# Patient Record
Sex: Female | Born: 1975 | Race: White | Hispanic: No | Marital: Single | Smoking: Never smoker
Health system: Southern US, Community
[De-identification: ages and names within clinical notes are randomized; demographics above are authoritative.]

## PROBLEM LIST (undated history)

## (undated) DIAGNOSIS — K5090 Crohn's disease, unspecified, without complications: Secondary | ICD-10-CM

## (undated) HISTORY — PX: APPENDECTOMY: SHX54

---

## 1995-05-28 HISTORY — PX: BREAST LUMPECTOMY: SHX2

## 2008-03-07 DIAGNOSIS — K5080 Crohn's disease of both small and large intestine without complications: Secondary | ICD-10-CM | POA: Insufficient documentation

## 2010-03-28 ENCOUNTER — Emergency Department: Payer: Self-pay | Admitting: Emergency Medicine

## 2010-03-29 ENCOUNTER — Emergency Department: Payer: Self-pay | Admitting: Emergency Medicine

## 2010-04-01 ENCOUNTER — Emergency Department: Payer: Self-pay | Admitting: Emergency Medicine

## 2010-04-05 ENCOUNTER — Emergency Department: Payer: Self-pay | Admitting: Emergency Medicine

## 2010-04-12 ENCOUNTER — Emergency Department: Payer: Self-pay | Admitting: Emergency Medicine

## 2014-04-09 ENCOUNTER — Emergency Department: Payer: Self-pay | Admitting: Internal Medicine

## 2016-10-08 ENCOUNTER — Emergency Department
Admission: EM | Admit: 2016-10-08 | Discharge: 2016-10-08 | Disposition: A | Discharge status: Home / Self Care | Payer: BLUE CROSS/BLUE SHIELD | Attending: Emergency Medicine | Admitting: Emergency Medicine

## 2016-10-08 ENCOUNTER — Emergency Department: Payer: BLUE CROSS/BLUE SHIELD

## 2016-10-08 ENCOUNTER — Encounter: Payer: Self-pay | Admitting: Emergency Medicine

## 2016-10-08 DIAGNOSIS — K5080 Crohn's disease of both small and large intestine without complications: Secondary | ICD-10-CM

## 2016-10-08 DIAGNOSIS — R1031 Right lower quadrant pain: Secondary | ICD-10-CM | POA: Diagnosis present

## 2016-10-08 HISTORY — DX: Crohn's disease, unspecified, without complications: K50.90

## 2016-10-08 LAB — COMPREHENSIVE METABOLIC PANEL
ALK PHOS: 56 U/L (ref 38–126)
ALT: 11 U/L — ABNORMAL LOW (ref 14–54)
AST: 12 U/L — ABNORMAL LOW (ref 15–41)
Albumin: 2.7 g/dL — ABNORMAL LOW (ref 3.5–5.0)
Anion gap: 6 (ref 5–15)
BILIRUBIN TOTAL: 0.7 mg/dL (ref 0.3–1.2)
BUN: 6 mg/dL (ref 6–20)
CO2: 26 mmol/L (ref 22–32)
CREATININE: 0.67 mg/dL (ref 0.44–1.00)
Calcium: 8.1 mg/dL — ABNORMAL LOW (ref 8.9–10.3)
Chloride: 106 mmol/L (ref 101–111)
GFR calc non Af Amer: >60 mL/min (ref >60)
Glucose, Bld: 164 mg/dL — ABNORMAL HIGH (ref 65–99)
POTASSIUM: 4.8 mmol/L (ref 3.5–5.1)
Sodium: 138 mmol/L (ref 135–145)
Total Protein: 6.2 g/dL — ABNORMAL LOW (ref 6.5–8.1)

## 2016-10-08 LAB — URINALYSIS, COMPLETE (UACMP) WITH MICROSCOPIC
BACTERIA UA: NONE SEEN
Bilirubin Urine: NEGATIVE
Glucose, UA: NEGATIVE mg/dL
Hgb urine dipstick: NEGATIVE
KETONES UR: 20 mg/dL — AB
Leukocytes, UA: NEGATIVE
Nitrite: NEGATIVE
PROTEIN: NEGATIVE mg/dL
Specific Gravity, Urine: 1.02 (ref 1.005–1.030)
pH: 5 (ref 5.0–8.0)

## 2016-10-08 LAB — POCT PREGNANCY, URINE: PREG TEST UR: NEGATIVE

## 2016-10-08 LAB — CBC
HEMATOCRIT: 35.5 % (ref 35.0–47.0)
HEMOGLOBIN: 11.3 g/dL — AB (ref 12.0–16.0)
MCH: 27.7 pg (ref 26.0–34.0)
MCHC: 31.8 g/dL — AB (ref 32.0–36.0)
MCV: 87.2 fL (ref 80.0–100.0)
Platelets: 455 10*3/uL — ABNORMAL HIGH (ref 150–440)
RBC: 4.08 MIL/uL (ref 3.80–5.20)
RDW: 18.5 % — ABNORMAL HIGH (ref 11.5–14.5)
WBC: 6.4 10*3/uL (ref 3.6–11.0)

## 2016-10-08 LAB — LIPASE, BLOOD: LIPASE: 14 U/L (ref 11–51)

## 2016-10-08 MED ORDER — IOPAMIDOL (ISOVUE-300) INJECTION 61%
15.0000 mL | INTRAVENOUS | Status: AC
  Administered 2016-10-08 (×2): 15 mL via ORAL

## 2016-10-08 MED ORDER — MORPHINE SULFATE (PF) 4 MG/ML IV SOLN
4.0000 mg | Freq: Once | INTRAVENOUS | Status: AC
Start: 2016-10-08 — End: 2016-10-08
  Administered 2016-10-08: 4 mg via INTRAVENOUS
  Filled 2016-10-08: qty 1

## 2016-10-08 MED ORDER — OXYCODONE-ACETAMINOPHEN 5-325 MG PO TABS
1.0000 | ORAL_TABLET | Freq: Once | ORAL | Status: AC
  Administered 2016-10-08: 1 via ORAL
  Filled 2016-10-08: qty 1

## 2016-10-08 MED ORDER — ONDANSETRON 4 MG PO TBDP: 4.0000 mg | ORAL_TABLET | Freq: Three times a day (TID) | ORAL | 0 refills | Status: DC | PRN

## 2016-10-08 MED ORDER — OXYCODONE-ACETAMINOPHEN 5-325 MG PO TABS: 1.0000 | ORAL_TABLET | ORAL | 0 refills | Status: DC | PRN

## 2016-10-08 MED ORDER — PREDNISONE 20 MG PO TABS: 60.0000 mg | ORAL_TABLET | Freq: Every day | ORAL | 0 refills | Status: AC

## 2016-10-08 MED ORDER — METHYLPREDNISOLONE SODIUM SUCC 125 MG IJ SOLR
125.0000 mg | Freq: Once | INTRAMUSCULAR | Status: AC
  Administered 2016-10-08: 125 mg via INTRAVENOUS
  Filled 2016-10-08: qty 2

## 2016-10-08 MED ORDER — IOPAMIDOL (ISOVUE-300) INJECTION 61%
100.0000 mL | Freq: Once | INTRAVENOUS | Status: AC | PRN
  Administered 2016-10-08: 100 mL via INTRAVENOUS

## 2016-10-08 MED ORDER — ONDANSETRON HCL 4 MG/2ML IJ SOLN
4.0000 mg | Freq: Once | INTRAMUSCULAR | Status: AC
  Administered 2016-10-08: 4 mg via INTRAVENOUS
  Filled 2016-10-08: qty 2

## 2016-10-08 NOTE — ED Triage Notes (Signed)
Pt c/o mid lower abdominal/suprapubic pain with nausea since last night. No vomiting.  Has crohns so always has diarrhea. PT reports normal BP in 90s.  Appears pale but boyfriend reports this is her normal color she just looks tired.

## 2016-10-08 NOTE — ED Provider Notes (Signed)
[facility] Emergency Department Provider Note    First MD Initiated Contact with Patient 10/08/16 1112     (approximate)  I have reviewed the triage vital signs and the nursing notes.   HISTORY  Chief Complaint Abdominal Pain   HPI Joann Haley is a 41 y.o. female with history of Crohn's disease presents with right lower quadrant abdominal pain that is described as sharp current pain score is 8 out of 10. Patient states pain started yesterday evening at 5 PM has progressively worsened since that time. She denies any nausea vomiting or diarrhea. Patient denies any rectal bleeding. Patient denies any fever. Patient states her last Crohn's flare was approximately 2-3 years ago. Patient does state that she is not on any current biologic treatment for Crohn's.   Past Medical History:  Diagnosis Date  . Crohn's disease (HCC)     There are no active problems to display for this patient.   Past Surgical History:  Procedure Laterality Date  . APPENDECTOMY      Prior to Admission medications   Medication Sig Start Date End Date Taking? Authorizing Provider  UNABLE TO FIND Take 1 tablet by mouth daily. Med Name: Low dose naltraxone 2.5 mg once daily   Yes [provider]  ondansetron (ZOFRAN ODT) 4 MG disintegrating tablet Take 1 tablet (4 mg total) by mouth every 8 (eight) hours as needed for nausea or vomiting. 10/08/16   Darci Current, MD  oxyCODONE-acetaminophen (ROXICET) 5-325 MG tablet Take 1 tablet by mouth every 4 (four) hours as needed for severe pain. 10/08/16   Darci Current, MD  predniSONE (DELTASONE) 20 MG tablet Take 3 tablets (60 mg total) by mouth daily. 10/08/16 10/13/16  Darci Current, MD    Allergies Peppermint oil  History reviewed. No pertinent family history.  Social History Social History  Substance Use Topics  . Smoking status: Never Smoker  . Smokeless tobacco: Never Used  . Alcohol use No    Review of  Systems Constitutional: No fever/chills Eyes: No visual changes. ENT: No sore throat. Cardiovascular: Denies chest pain. Respiratory: Denies shortness of breath. Gastrointestinal: Positive right lower quadrant abdominal pain.  No nausea, no vomiting.  No diarrhea.  No constipation. Genitourinary: Negative for dysuria. Musculoskeletal: Negative for back pain. Integumentary: Negative for rash. Neurological: Negative for headaches, focal weakness or numbness.  ____________________________________________   PHYSICAL EXAM:  VITAL SIGNS: ED Triage Vitals  Enc Vitals Group     BP 10/08/16 0943 (!) 98/55     Pulse Rate 10/08/16 0943 (!) 52     Resp 10/08/16 0943 16     Temp 10/08/16 0943 97.5 F (36.4 C)     Temp Source 10/08/16 0943 Oral     SpO2 10/08/16 0943 100 %     Weight 10/08/16 0945 135 lb (61.2 kg)     Height 10/08/16 0945 5\' 7"  (1.702 m)     Head Circumference --      Peak Flow --      Pain Score 10/08/16 0943 10     Pain Loc --      Pain Edu? --      Excl. in GC? --     Constitutional: Alert and oriented. Well appearing and in no acute distress. Eyes: Conjunctivae are normal. PERRL. EOMI. Head: Atraumatic.Marland Kitchen Mouth/Throat: Mucous membranes are moist.  Oropharynx non-erythematous. Neck: No stridor.  Cardiovascular: Normal rate, regular rhythm. Good peripheral circulation. Grossly normal heart sounds. Respiratory: Normal respiratory  effort.  No retractions. Lungs CTAB. Gastrointestinal: Right lower quadrant tenderness to palpation. No distention.   Musculoskeletal: No lower extremity tenderness nor edema. No gross deformities of extremities. Neurologic:  Normal speech and language. No gross focal neurologic deficits are appreciated.  Skin:  Skin is warm, dry and intact. No rash noted. Psychiatric: Mood and affect are normal. Speech and behavior are normal.  ____________________________________________   LABS (all labs ordered are listed, but only abnormal results  are displayed)  Labs Reviewed  COMPREHENSIVE METABOLIC PANEL - Abnormal; Notable for the following:       Result Value   Glucose, Bld 164 (*)    Calcium 8.1 (*)    Total Protein 6.2 (*)    Albumin 2.7 (*)    AST 12 (*)    ALT 11 (*)    All other components within normal limits  CBC - Abnormal; Notable for the following:    Hemoglobin 11.3 (*)    MCHC 31.8 (*)    RDW 18.5 (*)    Platelets 455 (*)    All other components within normal limits  URINALYSIS, COMPLETE (UACMP) WITH MICROSCOPIC - Abnormal; Notable for the following:    Color, Urine YELLOW (*)    APPearance HAZY (*)    Ketones, ur 20 (*)    Squamous Epithelial / LPF 0-5 (*)    All other components within normal limits  LIPASE, BLOOD  POC URINE PREG, ED  POCT PREGNANCY, URINE     RADIOLOGY I, Easton N Joann Haley, personally viewed and evaluated these images (plain radiographs) as part of my medical decision making, as well as reviewing the written report by the radiologist.  Ct Abdomen Pelvis W Contrast  Result Date: 10/08/2016 CLINICAL DATA:  Pt c/o mid lower abdominal/suprapubic pain with nausea since last night. No vomiting. Has crohns so always has diarrhea. PT reports normal BP in 90s. Appears pale but boyfriend reports this is her normal color she just looks ti.*comment was truncated*^11900mL ISOVUE-300 IOPAMIDOL (ISOVUE-300) INJECTION 61% EXAM: CT ABDOMEN AND PELVIS WITH CONTRAST TECHNIQUE: Multidetector CT imaging of the abdomen and pelvis was performed using the standard protocol following bolus administration of intravenous contrast. CONTRAST:  100mL ISOVUE-300 IOPAMIDOL (ISOVUE-300) INJECTION 61% COMPARISON:  None. FINDINGS: Lower chest: No acute abnormality. Hepatobiliary: Gallbladder is distended but otherwise unremarkable. No focal liver abnormality is seen. No biliary dilatation. Pancreas: Unremarkable. No pancreatic ductal dilatation or surrounding inflammatory changes. Spleen: Normal in size without focal  abnormality. Adrenals/Urinary Tract: Adrenal glands are unremarkable. Kidneys are normal, without renal calculi, focal lesion, or hydronephrosis. Bladder is unremarkable. Stomach/Bowel: Edematous thickening of the walls of the entire colon, moderate amount of stool in the right colon. Fairly severe thickening of the walls of the terminal ileum. Milder thickening of the walls of the more proximal ileum which has a patulous appearance. The more proximal small bowel (duodenum and jejunum) appear normal. Vascular/Lymphatic: No significant vascular findings are present. No enlarged abdominal or pelvic lymph nodes. Reproductive: Cystic-appearing mass in the right adnexa, measuring 3.1 cm, difficult to characterize due to surrounding free fluid, most likely a small ovarian cyst. No left adnexal mass. Other: Moderate free fluid in the pelvis. No circumscribed abscess collection. No free intraperitoneal air seen. No pneumatosis intestinalis seen. No bowel fistula identified. Musculoskeletal: Mild levoscoliosis of the thoracolumbar spine. Mild disc desiccations in the lower lumbar spine. No acute or suspicious osseous finding. Superficial soft tissues are unremarkable. IMPRESSION: 1. Fairly marked thickening of the walls of the distal small  bowel, involving the terminal ileum and distal ileum, compatible with active phase of patient's known Crohn's disease. Milder thickening of the walls of the more proximal ileum with an associated patulous configuration. Duodenum and jejunum appear normal. No associated bowel obstruction. 2. Additional thickening of the walls of the entire colon, moderate in degree, with associated pericolonic inflammation, also likely related to patient's known inflammatory bowel disease. Moderate amount of stool within the right colon, possibly associated focal colonic ileus? Again, no evidence of associated bowel obstruction. 3. Moderate amount of free fluid in the lower pelvis, likely related to the  active inflammatory bowel disease. No abscess collection seen. No free intraperitoneal air. No bowel fistula seen. 4. Additional chronic/incidental findings detailed above. Electronically Signed   By: Bary Richard M.D.   On: 10/08/2016 13:25      Procedures   ____________________________________________   INITIAL IMPRESSION / ASSESSMENT AND PLAN / ED COURSE  Pertinent labs & imaging results that were available during my care of the patient were reviewed by me and considered in my medical decision making (see chart for details).  41 year old female with history of Crohn's disease presents with right lower quadrant abdominal pain. Suspect Crohn's exacerbation as etiology for the patient's pain CT scan consistent with beforementioned. As such patient received IV morphine and Zofran emergency department improvement of pain from 8 out of 10-5 out of 10. Patient subsequently given IV Solu-Medrol and 25 mg, Percocet 09/27/2023 one tablet. Patient is advised to follow-up with gastroenterologist Dr. Tobi Bastos for further outpatient evaluation and management. Patient prescribed prednisone Percocet and Zofran for home      ____________________________________________  FINAL CLINICAL IMPRESSION(S) / ED DIAGNOSES  Final diagnoses:  Crohn's disease of both small and large intestine without complication (HCC)     MEDICATIONS GIVEN DURING THIS VISIT:  Medications  morphine 4 MG/ML injection 4 mg (4 mg Intravenous Given 10/08/16 1148)  ondansetron (ZOFRAN) injection 4 mg (4 mg Intravenous Given 10/08/16 1148)  iopamidol (ISOVUE-300) 61 % injection 15 mL (15 mLs Oral Contrast Given 10/08/16 1238)  iopamidol (ISOVUE-300) 61 % injection 100 mL (100 mLs Intravenous Contrast Given 10/08/16 1303)  methylPREDNISolone sodium succinate (SOLU-MEDROL) 125 mg/2 mL injection 125 mg (125 mg Intravenous Given 10/08/16 1356)  oxyCODONE-acetaminophen (PERCOCET/ROXICET) 5-325 MG per tablet 1 tablet (1 tablet Oral Given  10/08/16 1428)     NEW OUTPATIENT MEDICATIONS STARTED DURING THIS VISIT:  Discharge Medication List as of 10/08/2016  3:16 PM    START taking these medications   Details  ondansetron (ZOFRAN ODT) 4 MG disintegrating tablet Take 1 tablet (4 mg total) by mouth every 8 (eight) hours as needed for nausea or vomiting., Starting Tue 10/08/2016, Print    oxyCODONE-acetaminophen (ROXICET) 5-325 MG tablet Take 1 tablet by mouth every 4 (four) hours as needed for severe pain., Starting Tue 10/08/2016, Print    predniSONE (DELTASONE) 20 MG tablet Take 3 tablets (60 mg total) by mouth daily., Starting Tue 10/08/2016, Until Sun 10/13/2016, Print        Discharge Medication List as of 10/08/2016  3:16 PM      Discharge Medication List as of 10/08/2016  3:16 PM       Note:  This document was prepared using Dragon voice recognition software and may include unintentional dictation errors.    Darci Current, MD 10/08/16 2213

## 2016-10-08 NOTE — ED Notes (Signed)
Iv dc'ed  D/c inst to pt and family.   

## 2017-04-21 ENCOUNTER — Other Ambulatory Visit: Payer: Self-pay

## 2017-04-22 ENCOUNTER — Encounter: Payer: Self-pay | Admitting: Gastroenterology

## 2017-04-22 ENCOUNTER — Other Ambulatory Visit: Payer: Self-pay

## 2017-04-22 ENCOUNTER — Ambulatory Visit (INDEPENDENT_AMBULATORY_CARE_PROVIDER_SITE_OTHER): Payer: BLUE CROSS/BLUE SHIELD | Admitting: Gastroenterology

## 2017-04-22 VITALS — BP 105/69 | HR 76 | Temp 98.2°F | Ht 67.0 in | Wt 139.8 lb

## 2017-04-22 DIAGNOSIS — K50813 Crohn's disease of both small and large intestine with fistula: Secondary | ICD-10-CM | POA: Diagnosis not present

## 2017-04-22 MED ORDER — RIFAXIMIN 550 MG PO TABS: 550.0000 mg | ORAL_TABLET | Freq: Three times a day (TID) | ORAL | 0 refills | Status: DC

## 2017-04-22 NOTE — Progress Notes (Signed)
Cephas Darby, MD [address]  [place]  [place], [place] [address]  Main: [phone]  Fax: [phone]    Gastroenterology Consultation  Referring Provider:     No ref. provider found Primary Care Physician:  Patient, No Pcp Per Primary Gastroenterologist:  Dr. Darrel Hoover Reason for Consultation:  Establish care for Crohn's disease        HPI:   Niko Jakel is a 41 y.o. female referred by Dr. Patient, No Pcp Per  for consultation & management of Crohn's disease.  She has history of ileocolonic, inflammatory Crohn's disease diagnosed in 2014 by Dr. Darrel Hoover at [facility] has not been on any immunosuppressive therapy since the diagnosis is made.  Apparently, she was recommended to be started on anti-TNF therapy but patient refused to take as she is worried about side effects and she generally does not tolerate most of the medications.  Currently, she reports having an average 4 BMs daily, soft, brown, nonbloody and not a/w abd pain, urgency, n/v/weight loss, nocturnal diarrhea, fecal incontinence.  She denies any upper GI symptoms.  She is trying modified carbohydrate diet that she thinks might be effective in controlling inflammation as she read a lot about the role of diet and Crohn's disease.  She is also taking curcumin daily.  Her most recent flareup was in 09/2016 when she presented to [facility] ER and she was discharged on prednisone with plan to follow-up with Dr. Vicente Males.  She did not tolerate prednisone well because it resulted in irritability.  She had mild anemia with thrombocytosis and hypoalbuminemia at that time. 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  She currently has a seton in place, denies pain, drainage  Crohn's disease classification:  Age: 27 to 66  Location: ileocolonic  Behavior: non stricturing, non penetrating   Perianal: Yes    IBD diagnosis: At age 31  Disease course: Started having GI  problems in elementary school, anorexia, cachexia, and sometime in high school she was originally diagnosed with UC.  Started on Azulfidine and prednisone.  She did not tolerate prednisone well.  She later saw Dr. Barbaraann Barthel at [facility] in early 2014, underwent colonoscopy was found to have ileocolonic disease and diagnosis of Crohn's disease was made.  Celiac serologies negative  Extra intestinal manifestations: None  IBD surgical history: Seton placement for perianal fistula, which was performed about 1 year ago.  Seton in place  She denies family history of IBD She does not smoke or drink etoh She is single, currently in a relationship, working  Imaging:  MRE not done CTE 09/16/2012 IMPRESSION: - Long segment wall thickening, mucosal enhancement and prominent adjacent vasculature identified throughout distal small bowel and colon. The colon is lacking in haustral markings. 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   SBFT none  Procedures:  Colonoscopy 09/16/2012 at [facility] - Hemorrhagic, inflamed and ulcerated mucosa in skip lesions in the entire examined colon, right > left.  Areas of scarring from prior inflammation. Biopsied from the right and left colon. Congested, erythematous and ulcerated mucosa in the terminal ileum. Biopsied. The distal rectum and anal verge are normal on retroflexion view. Diagnosis: A: Terminal ileum, biopsy  - Moderately active chronic enteritis with detached fragments of ulcer debris and inflamed granulation tissue  - No viral cytopathic effect, granuloma, or dysplasia identified - See comment   B: Colon, right, biopsy  -  Moderately active chronic colitis with detached fragments of inflamed granulation tissue and ulcer debris - No viral cytopathic effect, granuloma, or dysplasia identified  - See comment   C: Colon, left, biopsy  - Moderately active chronic colitis with detached fragments  of inflamed granulation tissue and ulcer debris - No viral cytopathic effect, granuloma, or dysplasia identified  Upper Endoscopy none  VCE none  IBD medications:  Steroids: Prednisone did not tolerate due to side effects, budesonide did not work 5-ASA: Azulfidine and Lialda, not effective Immunomodulators: AZA, methotrexate did not try TPMT status unknown Biologics: Nave Anti TNFs: Anti Integrins: Ustekinumab: Tofactinib: Clinical trial:  NSAIDs: None  Antiplts/Anticoagulants/Anti thrombotics: None  Outside labs from 03/21/2017 BMP normal CBC hemoglobin 11.5, MCV 90, platelets 419 Lipid panel normal Hepatic function panel normal LFTs except albumin 3.0 Vitamin B12 568 folate 18.9 TSH 1.6 Vitamin D 23.5 ESR 28 CRP 23.3 Ferritin 12  Past Medical History:  Diagnosis Date  . Crohn's disease [facility])     Past Surgical History:  Procedure Laterality Date  . APPENDECTOMY    . BREAST LUMPECTOMY Right 1997    Prior to Admission medications   Medication Sig Start Date End Date Taking? Authorizing Provider  ferrous sulfate 325 (65 FE) MG EC tablet Take 325 mg by mouth.    [provider]  folic acid (FOLVITE) 1 MG tablet Take 1 mg by mouth. 04/23/11   [provider]  Multiple Vitamin (MULTI-VITAMINS) TABS Take by mouth.    [provider]    Family History  Problem Relation Age of Onset  . Diabetes Mother   . Multiple myeloma Father      Social History   Tobacco Use  . Smoking status: Never Smoker  . Smokeless tobacco: Never Used  Substance Use Topics  . Alcohol use: No  . Drug use: No    Allergies as of 04/22/2017 - Review Complete 10/08/2016  Allergen Reaction Noted  . Peppermint oil  09/16/2012  . Latex Rash and Itching 09/16/2012    Review of Systems:    All systems reviewed and negative except where noted in HPI.   Physical Exam:  BP 105/69   Pulse 76   Temp 98.2 F (36.8 C) (Oral)   Ht '5\' 7"'  (1.702 m)   Wt 139  lb 12.8 oz (63.4 kg)   BMI 21.90 kg/m  No LMP recorded.  General:   Alert, thin built, pleasant and cooperative in NAD Head:  Normocephalic and atraumatic. Eyes:  Sclera clear, no icterus.   Conjunctiva pink. Ears:  Normal auditory acuity. Nose:  No deformity, discharge, or lesions. Mouth:  No deformity or lesions,oropharynx pink & moist. Neck:  Supple; no masses or thyromegaly. Lungs:  Respirations even and unlabored.  Clear throughout to auscultation.   No wheezes, crackles, or rhonchi. No acute distress. Heart:  Regular rate and rhythm; no murmurs, clicks, rubs, or gallops. Abdomen:  Normal bowel sounds. Soft, non-tender and non-distended without masses, hepatosplenomegaly or hernias noted.  No guarding or rebound tenderness.   Rectal: Nor performed Msk:  Symmetrical without gross deformities. Good, equal movement & strength bilaterally. Pulses:  Normal pulses noted. Extremities:  No clubbing or edema.  No cyanosis. Neurologic:  Alert and oriented x3;  grossly normal neurologically. Skin:  Intact without significant lesions or rashes. No jaundice. Lymph Nodes:  No significant cervical adenopathy. Psych:  Alert and cooperative. Normal mood and affect.  Imaging Studies: Reviewed  Assessment and Plan:   Tarika Mckethan is a  41 y.o. white female with ileocolonic Crohn's disease diagnosed in 08/2012 previously on mesalamine, prednisone and budesonide, currently not on any maintenance therapy has been doing fairly well clinically.  She does have iron deficiency anemia and hypoalbuminemia secondary to untreated Crohn's disease.  Based on her colonoscopy results I strongly recommended her to be started on immunosuppressive therapy but patient is persistent in not pursuing this route as she strongly believes that making dietary adjustments may get her disease under control.  I discussed with her about long-term complications associated with Crohn's disease including malignancy, infections,  malnutrition, surgery.  She inquired about fecal transplantation but we do not have any evidence to support that in IBD population at present. I suggested her trial of rifaximin for 4 weeks based on recent small study and she is agreeable to that She will continue curcumin extract.  She did not seem to be interested in trying other mesalamine formulation I counseled her about immunizations against hepatitis a and B, pneumonia and influenza.  She is immune to hepatitis B.  She declined influenza and pneumonia vaccines. She will continue taking oral iron for iron deficiency anemia Recommend that she will need surveillance colonoscopies for dysplasia surveillance in 8 years from disease onset every 1-2 years  Follow up in 43month   RCephas Darby MD

## 2017-05-16 ENCOUNTER — Other Ambulatory Visit: Payer: Self-pay | Admitting: Gastroenterology

## 2017-05-28 ENCOUNTER — Telehealth: Payer: Self-pay | Admitting: Gastroenterology

## 2017-05-28 NOTE — Telephone Encounter (Signed)
Pt stated that she did not need rx for xifaxan because it made her symptoms worse.  She stated that she is watching what she eats and taking a probiotic called "Probium".  Thanks [employer]le

## 2017-05-28 NOTE — Telephone Encounter (Signed)
Patient left a voice message that she would like for you to call her. She doesn't need the Xifaxan called in. It increased her symtoms and didn't do well with her. Please call her.

## 2017-09-19 ENCOUNTER — Telehealth: Payer: Self-pay | Admitting: Gastroenterology

## 2017-09-19 NOTE — Telephone Encounter (Signed)
Called patient regarding recall appt. Victorino DikeJennifer stated that she is seeing a HydrologistGastorenterologist in [place].

## 2017-11-01 ENCOUNTER — Emergency Department: Payer: BLUE CROSS/BLUE SHIELD

## 2017-11-01 ENCOUNTER — Other Ambulatory Visit: Payer: Self-pay

## 2017-11-01 ENCOUNTER — Emergency Department
Admission: EM | Admit: 2017-11-01 | Discharge: 2017-11-01 | Disposition: A | Discharge status: Home / Self Care | Payer: BLUE CROSS/BLUE SHIELD | Attending: Emergency Medicine | Admitting: Emergency Medicine

## 2017-11-01 DIAGNOSIS — Z79899 Other long term (current) drug therapy: Secondary | ICD-10-CM | POA: Diagnosis not present

## 2017-11-01 DIAGNOSIS — R110 Nausea: Secondary | ICD-10-CM | POA: Insufficient documentation

## 2017-11-01 DIAGNOSIS — N200 Calculus of kidney: Secondary | ICD-10-CM

## 2017-11-01 DIAGNOSIS — R109 Unspecified abdominal pain: Secondary | ICD-10-CM | POA: Diagnosis present

## 2017-11-01 DIAGNOSIS — N23 Unspecified renal colic: Secondary | ICD-10-CM | POA: Diagnosis not present

## 2017-11-01 LAB — URINALYSIS, COMPLETE (UACMP) WITH MICROSCOPIC
BACTERIA UA: NONE SEEN
BILIRUBIN URINE: NEGATIVE
Glucose, UA: NEGATIVE mg/dL
KETONES UR: NEGATIVE mg/dL
Leukocytes, UA: NEGATIVE
Nitrite: NEGATIVE
PH: 5 (ref 5.0–8.0)
PROTEIN: NEGATIVE mg/dL
Specific Gravity, Urine: 1.02 (ref 1.005–1.030)

## 2017-11-01 LAB — CBC
HCT: 35.3 % (ref 35.0–47.0)
HEMOGLOBIN: 12.4 g/dL (ref 12.0–16.0)
MCH: 31.5 pg (ref 26.0–34.0)
MCHC: 35.1 g/dL (ref 32.0–36.0)
MCV: 89.8 fL (ref 80.0–100.0)
PLATELETS: 385 10*3/uL (ref 150–440)
RBC: 3.93 MIL/uL (ref 3.80–5.20)
RDW: 16.1 % — ABNORMAL HIGH (ref 11.5–14.5)
WBC: 9.1 10*3/uL (ref 3.6–11.0)

## 2017-11-01 LAB — LIPASE, BLOOD: LIPASE: 29 U/L (ref 11–51)

## 2017-11-01 LAB — HEPATIC FUNCTION PANEL
ALBUMIN: 3 g/dL — AB (ref 3.5–5.0)
ALT: 11 U/L — ABNORMAL LOW (ref 14–54)
AST: 12 U/L — ABNORMAL LOW (ref 15–41)
Alkaline Phosphatase: 53 U/L (ref 38–126)
Bilirubin, Direct: <0.1 mg/dL — ABNORMAL LOW (ref 0.1–0.5)
TOTAL PROTEIN: 6.4 g/dL — AB (ref 6.5–8.1)
Total Bilirubin: 0.4 mg/dL (ref 0.3–1.2)

## 2017-11-01 LAB — BASIC METABOLIC PANEL
ANION GAP: 8 (ref 5–15)
BUN: 16 mg/dL (ref 6–20)
CALCIUM: 8.1 mg/dL — AB (ref 8.9–10.3)
CO2: 22 mmol/L (ref 22–32)
CREATININE: 0.79 mg/dL (ref 0.44–1.00)
Chloride: 106 mmol/L (ref 101–111)
GFR calc Af Amer: >60 mL/min (ref >60)
Glucose, Bld: 181 mg/dL — ABNORMAL HIGH (ref 65–99)
Potassium: 3.3 mmol/L — ABNORMAL LOW (ref 3.5–5.1)
Sodium: 136 mmol/L (ref 135–145)

## 2017-11-01 LAB — HCG, QUANTITATIVE, PREGNANCY: hCG, Beta Chain, Quant, S: 1 m[IU]/mL (ref <5)

## 2017-11-01 MED ORDER — LIDOCAINE HCL (CARDIAC) PF 100 MG/5ML IV SOSY
1.5000 mg/kg | PREFILLED_SYRINGE | INTRAVENOUS | Status: AC
  Administered 2017-11-01: 87.8 mg via INTRAVENOUS
  Filled 2017-11-01: qty 5

## 2017-11-01 MED ORDER — TAMSULOSIN HCL 0.4 MG PO CAPS: ORAL_CAPSULE | ORAL | 0 refills | Status: AC

## 2017-11-01 MED ORDER — KETOROLAC TROMETHAMINE 30 MG/ML IJ SOLN
15.0000 mg | Freq: Once | INTRAMUSCULAR | Status: AC
  Administered 2017-11-01: 15 mg via INTRAVENOUS
  Filled 2017-11-01: qty 1

## 2017-11-01 MED ORDER — ONDANSETRON HCL 4 MG/2ML IJ SOLN
INTRAMUSCULAR | Status: AC
  Administered 2017-11-01: 4 mg via INTRAVENOUS
  Filled 2017-11-01: qty 2

## 2017-11-01 MED ORDER — MORPHINE SULFATE (PF) 4 MG/ML IV SOLN
4.0000 mg | Freq: Once | INTRAVENOUS | Status: AC
  Administered 2017-11-01: 4 mg via INTRAVENOUS

## 2017-11-01 MED ORDER — HYDROCODONE-ACETAMINOPHEN 5-325 MG PO TABS: 1.0000 | ORAL_TABLET | ORAL | 0 refills | Status: AC | PRN

## 2017-11-01 MED ORDER — DOCUSATE SODIUM 100 MG PO CAPS: ORAL_CAPSULE | ORAL | 0 refills | Status: AC

## 2017-11-01 MED ORDER — ONDANSETRON 4 MG PO TBDP: ORAL_TABLET | ORAL | 0 refills | Status: AC

## 2017-11-01 MED ORDER — MORPHINE SULFATE (PF) 4 MG/ML IV SOLN
INTRAVENOUS | Status: AC
  Administered 2017-11-01: 4 mg via INTRAVENOUS
  Filled 2017-11-01: qty 1

## 2017-11-01 MED ORDER — SODIUM CHLORIDE 0.9 % IV BOLUS
1000.0000 mL | INTRAVENOUS | Status: AC
  Administered 2017-11-01: 1000 mL via INTRAVENOUS

## 2017-11-01 MED ORDER — ONDANSETRON HCL 4 MG/2ML IJ SOLN
4.0000 mg | INTRAMUSCULAR | Status: AC
  Administered 2017-11-01: 4 mg via INTRAVENOUS

## 2017-11-01 NOTE — ED Provider Notes (Signed)
Signout from Dr. York CeriseForbach in this 42 year old female with a history of Crohn's disease who was found to have a left-sided UPJ stone.  Plan is to reassess after the patient's urine has returned.  Patient with normal white blood cell count.  Initially given morphine and then Toradol and lidocaine and is now pain-free.  Nontoxic-appearing and afebrile.  Physical Exam  BP 97/60   Pulse 73   Temp 97.6 F (36.4 C) (Oral)   Resp 20   Ht 5\' 7"  (1.702 m)   Wt 58.5 kg (129 lb)   LMP 10/17/2017 (Approximate)   SpO2 100%   BMI 20.20 kg/m   Physical Exam  ED Course/Procedures   Clinical Course as of Nov 02 842  Sat Nov 01, 2017  0603 obstructing 6 x 5 mm stone noted proximally at the left ureteropelvic junction.  Patient still having severe pain.  I have ordered Toradol 15 mg IV as well as lidocaine 1.5 mg/kg which has demonstrated good results with renal colic.  I will reassess and then determine the need for urology consult.  CT scan also questions the possibility of Crohn's exacerbation, but the patient is having no other GI symptoms and has no leukocytosis, no diarrhea, and no vomiting.  I think she likely has chronic changes without evidence of any acute exacerbation of her Crohn's.   [CF]  0725 [name] pain is completely resolved after the Toradol and lidocaine.  She has not yet provided a urine sample but thinks she may be able to.  She continues to have no sign of infection and certainly not of sepsis.  She is comfortable with plan for discharge and outpatient follow-up assuming that there is no sign of severe urinary tract infection.  She understands the importance of outpatient follow-up with urology and that she may be eligible for lithotripsy this coming Thursday if she follows up with a phone call on Monday to schedule follow-up appointment.   [CF]  0727 I reviewed the [place] controlled substance database report and she has a low risk for abuse potential.  She has no active  prescriptions for controlled substances and had only 2 small prescriptions more than a year ago for Percocet.   [CF]  [id number] Transferred ED care to Dr. Pershing ProudSchaevitz to follow up on UA results to determine if antibiotics (or urology consultation) are necessary.  Paperwork already prepared anticipation discharge and outpatient follow up.   [CF]    Clinical Course User Index [CF] Joann Haley, Cory, MD    Procedures  MDM  ----------------------------------------- 8:45 AM on 11/01/2017 -----------------------------------------  Very reassuring appearing urine with no white blood cells, negative nitrite and bacteria.  Negative for leukocytes.  Patient continues to be pain-free.  She is aware of the diagnosis of the kidney stone.  We also reviewed the CT imaging as well as the lab results.  She understands return for any worsening concerning symptoms especially nausea, vomiting and fever.  She will be discharged at this time.  She will be following up with urology in the office as long as she does not require return to the emergency department.      Joann Haley, David Matthew, MD 11/01/17 [phone]

## 2017-11-01 NOTE — Discharge Instructions (Signed)
You have been seen in the Emergency Department (ED) today for pain that we believe based on your workup, is caused by kidney stones.  As we have discussed, please drink plenty of fluids.  Please make a follow up appointment with the physician(s) listed elsewhere in this documentation.  You may take pain medication as needed but ONLY as prescribed.  Please also take your prescribed Flomax daily.  We also recommend that you take over-the-counter ibuprofen regularly according to label instructions over the next 5 days.  Take it with meals to minimize stomach discomfort.  However, do not take ibuprofen, aspirin, naproxen, or any other NSAID medications starting on Tuesday morning.  If you are still having symptoms by Thursday you may be eligible for lithotripsy, but you will not be able to have that procedure to remove the stone if you have had any NSAIDs starting on Tuesday morning.  Please call the urology office on Monday morning to set up a follow-up appointment so we can discuss these options with them.  Please see your doctor as soon as possible as stones may take 1-3 weeks to pass and you may require additional care or medications.  Do not drink alcohol, drive or participate in any other potentially dangerous activities while taking opiate pain medication as it may make you sleepy. Do not take this medication with any other sedating medications, either prescription or over-the-counter. If you were prescribed Percocet or Vicodin, do not take these with acetaminophen (Tylenol) as it is already contained within these medications.   Take Norco as needed for severe pain.  This medication is an opiate (or narcotic) pain medication and can be habit forming.  Use it as little as possible to achieve adequate pain control.  Do not use or use it with extreme caution if you have a history of opiate abuse or dependence.  If you are on a pain contract with your primary care doctor or a pain specialist, be sure to let  them know you were prescribed this medication today from the [facility].  This medication is intended for your use only - do not give any to anyone else and keep it in a secure place where nobody else, especially children, have access to it.  It will also cause or worsen constipation, so you may want to consider taking an over-the-counter stool softener while you are taking this medication.  Return to the Emergency Department (ED) or call your doctor if you have any worsening pain, fever, painful urination, are unable to urinate, or develop other symptoms that concern you.

## 2017-11-01 NOTE — ED Notes (Signed)
CT notified that Dr York CeriseForbach would like CT immediately and not wait for pregnancy test.

## 2017-11-01 NOTE — ED Triage Notes (Signed)
Reports left flank pain with nausea that started around 1 am today.

## 2017-11-01 NOTE — ED Notes (Addendum)
Pt hooked up to BP cuff and o2 monitor by this tech, Rn notified pt in exam room 18

## 2017-11-01 NOTE — ED Notes (Signed)
Patient transported to CT 

## 2017-11-01 NOTE — ED Notes (Signed)
Patient to ED for left flank pain. States it started around midnight and it woke her up. States the pain is constant and "can't really describe it" just states it is the worst pain she has ever had. States she has history of Crohn's disease but this is a little different. Denies dysuria, urgency or frequency and states she has a little lower back pain. Patient is alert and oriented x 4, skin warm/dry, patient is nauseated but without emesis at this time. Patient tolerated IV insertion well. Denies allergies to any medications she is aware of. Drove herself to the hospital but states she has someone to pick her up if need be.

## 2017-11-01 NOTE — ED Provider Notes (Signed)
[facility] Emergency Department Provider Note  ____________________________________________   First MD Initiated Contact with Patient 11/01/17 0421     (approximate)  I have reviewed the triage vital signs and the nursing notes.   HISTORY  Chief Complaint Flank Pain    HPI Joann Haley is a 42 y.o. female with a history of Crohn's disease but who does not take any chronic medications for it who presents by private vehicle for evaluation of acute onset severe left flank pain that is sharp, constant, and radiates around to the left anterior abdomen.  She reports that it feels different from prior issues she has had with Crohn's disease.  She was in her usual state of health except for some intermittent back pain that was mild over the course of the week, about which she did not give much concern her consideration.  The acute onset and severe left flank pain awoke her from sleep.  She has had some nausea that is severe but no vomiting.  She denies lower abdominal pain.  She denies dysuria, hematuria, and any increase in urgency or frequency.  She says there is no chance that she could be pregnant.  She denies fever/chills, chest pain, shortness of breath.  She has no history of kidney stones.  She is not on any biological immunosuppressive medication for Crohn's disease such as Remicade and she does not take prednisone regularly.  Past Medical History:  Diagnosis Date  . Crohn's disease [facility])     Patient Active Problem List   Diagnosis Date Noted  . Crohn's disease of both small and large intestine ([place]) 03/07/2008    Past Surgical History:  Procedure Laterality Date  . APPENDECTOMY    . BREAST LUMPECTOMY Right 1997    Prior to Admission medications   Medication Sig Start Date End Date Taking? Authorizing Provider  docusate sodium (COLACE) 100 MG capsule Take 1 tablet once or twice daily as needed for constipation while taking narcotic pain medicine  11/01/17   Hinda Kehr, MD  ferrous sulfate 325 (65 FE) MG EC tablet Take 325 mg by mouth.    [provider]  folic acid (FOLVITE) 1 MG tablet Take 1 mg by mouth. 04/23/11   [provider]  HYDROcodone-acetaminophen (NORCO/VICODIN) 5-325 MG tablet Take 1-2 tablets by mouth every 4 (four) hours as needed for moderate pain. 11/01/17   Hinda Kehr, MD  Multiple Vitamin (MULTI-VITAMINS) TABS Take by mouth.    [provider]  ondansetron (ZOFRAN ODT) 4 MG disintegrating tablet Allow 1-2 tablets to dissolve in your mouth every 8 hours as needed for nausea/vomiting 11/01/17   Hinda Kehr, MD  tamsulosin [facility]) 0.4 MG CAPS capsule Take 1 tablet by mouth daily until you pass the kidney stone or no longer have symptoms 11/01/17   Hinda Kehr, MD    Allergies Peppermint oil and Latex  Family History  Problem Relation Age of Onset  . Diabetes Mother   . Multiple myeloma Father     Social History Social History   Tobacco Use  . Smoking status: Never Smoker  . Smokeless tobacco: Never Used  Substance Use Topics  . Alcohol use: No  . Drug use: No    Review of Systems Constitutional: No fever/chills Eyes: No visual changes. ENT: No sore throat. Cardiovascular: Denies chest pain. Respiratory: Denies shortness of breath. Gastrointestinal: Left-sided flank and abdominal pain as described above.  Nausea but no vomiting.  No diarrhea. Genitourinary: Negative for dysuria.  Negative  for hematuria. Musculoskeletal: Negative for neck pain.  Negative for back pain. Integumentary: Negative for rash. Neurological: Negative for headaches, focal weakness or numbness.   ____________________________________________   PHYSICAL EXAM:  VITAL SIGNS: ED Triage Vitals  Enc Vitals Group     BP 11/01/17 0349 (!) 103/55     Pulse Rate 11/01/17 0349 71     Resp 11/01/17 0349 20     Temp 11/01/17 0349 97.7 F (36.5 C)     Temp Source 11/01/17 0349 Oral     SpO2 11/01/17  0349 99 %     Weight 11/01/17 0347 58.5 kg (129 lb)     Height 11/01/17 0347 1.702 m (_0 )     Head Circumference --      Peak Flow --      Pain Score 11/01/17 0428 10     Pain Loc --      Pain Edu? --      Excl. in [place]? --     Constitutional: Alert and oriented.  Pale and appears acutely uncomfortable but is now somewhat somnolent after morphine. Eyes: Conjunctivae are normal.  Head: Atraumatic. Nose: No congestion/rhinnorhea. Mouth/Throat: Mucous membranes are moist. Neck: No stridor.  No meningeal signs.   Cardiovascular: Normal rate, regular rhythm. Good peripheral circulation. Grossly normal heart sounds. Respiratory: Normal respiratory effort.  No retractions. Lungs CTAB. Gastrointestinal: Soft and nondistended.  Tender to palpation to the epigastrium, no lower abdominal tenderness. Musculoskeletal: No lower extremity tenderness nor edema. No gross deformities of extremities.  Mild left CVA tenderness to percussion. Neurologic:  Normal speech and language. No gross focal neurologic deficits are appreciated.  Skin:  Skin is pale, warm, dry and intact. No rash noted. Psychiatric: Mood and affect are normal. Speech and behavior are normal.  ____________________________________________   LABS (all labs ordered are listed, but only abnormal results are displayed)  Labs Reviewed  BASIC METABOLIC PANEL - Abnormal; Notable for the following components:      Result Value   Potassium 3.3 (*)    Glucose, Bld 181 (*)    Calcium 8.1 (*)    All other components within normal limits  CBC - Abnormal; Notable for the following components:   RDW 16.1 (*)    All other components within normal limits  HEPATIC FUNCTION PANEL - Abnormal; Notable for the following components:   Total Protein 6.4 (*)    Albumin 3.0 (*)    AST 12 (*)    ALT 11 (*)    Bilirubin, Direct <0.1 (*)    All other components within normal limits  LIPASE, BLOOD  HCG, QUANTITATIVE, PREGNANCY  URINALYSIS,  COMPLETE (UACMP) WITH MICROSCOPIC  POC URINE PREG, ED   ____________________________________________  EKG  None - EKG not ordered by ED physician ____________________________________________  RADIOLOGY   ED MD interpretation:  6x57m obstructing stone (left UVJ), chronic Crohn's changes  Official radiology report(s): Ct Renal Stone Study  Result Date: 11/01/2017 CLINICAL DATA:  Acute onset of left flank pain and nausea. Lower back pain. Personal history of Crohn's disease. EXAM: CT ABDOMEN AND PELVIS WITHOUT CONTRAST TECHNIQUE: Multidetector CT imaging of the abdomen and pelvis was performed following the standard protocol without IV contrast. COMPARISON:  CT of the abdomen and pelvis performed 10/08/2016 FINDINGS: Lower chest: The visualized lung bases are grossly clear. The visualized portions of the mediastinum are unremarkable. Hepatobiliary: The liver is unremarkable in appearance. The gallbladder is unremarkable in appearance. The common bile duct remains normal in caliber. Pancreas: The  pancreas is within normal limits. Spleen: The spleen is unremarkable in appearance. Adrenals/Urinary Tract: The adrenal glands are unremarkable. Mild left-sided hydronephrosis is noted, with an obstructing 6 x 5 mm stone noted proximally at the left ureteropelvic junction. The right kidney is unremarkable in appearance. No nonobstructing renal stones are identified. No significant perinephric stranding is seen. Stomach/Bowel: There is diffuse wall thickening along the distal ileum, reflecting exacerbation of the patient's Crohn's disease. The patient is status post appendectomy. Mild fatty infiltration of the wall of the colon reflects sequelae of chronic inflammation. Vascular/Lymphatic: The abdominal aorta is unremarkable in appearance. The inferior vena cava is grossly unremarkable. No retroperitoneal lymphadenopathy is seen. No pelvic sidewall lymphadenopathy is identified. Reproductive: The bladder is  mildly distended and grossly unremarkable. The uterus is grossly unremarkable. No suspicious adnexal masses are seen. Other: No additional soft tissue abnormalities are seen. Musculoskeletal: No acute osseous abnormalities are identified. The visualized musculature is unremarkable in appearance. IMPRESSION: 1. Mild left-sided hydronephrosis, with an obstructing 6 x 5 mm stone noted proximally at the left ureteropelvic junction. 2. Diffuse wall thickening along the distal ileum, reflecting exacerbation of the patient's Crohn's disease. 3. Mild fatty infiltration of the wall of the colon reflects sequelae of chronic inflammation. Electronically Signed   By: Garald Balding M.D.   On: 11/01/2017 05:22    ____________________________________________   PROCEDURES  Critical Care performed: No   Procedure(s) performed:   Procedures   ____________________________________________   INITIAL IMPRESSION / ASSESSMENT AND PLAN / ED COURSE  As part of my medical decision making, I reviewed the following data within the [place] notes reviewed and incorporated, Labs reviewed , Patient signed out to  and [place] Controlled Substance Database    Differential diagnosis includes, but is not limited to, kidney/ureteral stone, pyelonephritis/UTI, diverticulitis, colitis including Crohn's flare, gastritis.  The patient states that there is no chance she could be pregnant as she would like to proceed with a CT scan which I have ordered.  In the meantime her hCG has come back negative, basic metabolic panel is unremarkable, CBC is normal with no leukocytosis, lipase is negative, and LFTs are nonacute.  Vital signs are stable.  Anticipate CT scan will demonstrate a ureteral stone.  I will reassess after the results are back.  She received morphine 4 mg IV and Zofran 4 mg IV.  Clinical Course as of Nov 02 743  Sat Nov 01, 2017  0603 obstructing 6 x 5 mm stone noted proximally at the left  ureteropelvic junction.  Patient still having severe pain.  I have ordered Toradol 15 mg IV as well as lidocaine 1.5 mg/kg which has demonstrated good results with renal colic.  I will reassess and then determine the need for urology consult.  CT scan also questions the possibility of Crohn's exacerbation, but the patient is having no other GI symptoms and has no leukocytosis, no diarrhea, and no vomiting.  I think she likely has chronic changes without evidence of any acute exacerbation of her Crohn's.   [CF]  0725 [name] pain is completely resolved after the Toradol and lidocaine.  She has not yet provided a urine sample but thinks she may be able to.  She continues to have no sign of infection and certainly not of sepsis.  She is comfortable with plan for discharge and outpatient follow-up assuming that there is no sign of severe urinary tract infection.  She understands the importance of outpatient follow-up with urology  and that she may be eligible for lithotripsy this coming Thursday if she follows up with a phone call on Monday to schedule follow-up appointment.   [CF]  0727 I reviewed the [place] controlled substance database report and she has a low risk for abuse potential.  She has no active prescriptions for controlled substances and had only 2 small prescriptions more than a year ago for Percocet.   [CF]  [id number] Transferred ED care to Dr. Clearnce Hasten to follow up on UA results to determine if antibiotics (or urology consultation) are necessary.  Paperwork already prepared anticipation discharge and outpatient follow up.   [CF]    Clinical Course User Index [CF] Hinda Kehr, MD    ____________________________________________  FINAL CLINICAL IMPRESSION(S) / ED DIAGNOSES  Final diagnoses:  Ureteral colic  Kidney stone     MEDICATIONS GIVEN DURING THIS VISIT:  Medications  morphine 4 MG/ML injection 4 mg (4 mg Intravenous Given 11/01/17 0426)  ondansetron (ZOFRAN)  injection 4 mg (4 mg Intravenous Given 11/01/17 0426)  sodium chloride 0.9 % bolus 1,000 mL (0 mLs Intravenous Stopped 11/01/17 0550)  ketorolac (TORADOL) 30 MG/ML injection 15 mg (15 mg Intravenous Given 11/01/17 0614)  lidocaine (cardiac) 100 mg/82m (XYLOCAINE) injection 2% 87.8 mg (87.8 mg Intravenous Given 11/01/17 0620)     ED Discharge Orders        Ordered    HYDROcodone-acetaminophen (NORCO/VICODIN) 5-325 MG tablet  Every 4 hours PRN     11/01/17 0728    ondansetron (ZOFRAN ODT) 4 MG disintegrating tablet     11/01/17 0728    tamsulosin (FLOMAX) 0.4 MG CAPS capsule     11/01/17 0728    docusate sodium (COLACE) 100 MG capsule     11/01/17 0728       Note:  This document was prepared using Dragon voice recognition software and may include unintentional dictation errors.    FHinda Kehr MD 11/01/17 [phone]

## 2017-11-01 NOTE — ED Notes (Addendum)
Pt stated to Dr Hardie LoraForbach that there is no chance of her being pregnant.

## 2017-11-12 ENCOUNTER — Encounter: Payer: Self-pay | Admitting: Urology

## 2017-11-12 ENCOUNTER — Ambulatory Visit (INDEPENDENT_AMBULATORY_CARE_PROVIDER_SITE_OTHER): Payer: BLUE CROSS/BLUE SHIELD | Admitting: Urology

## 2017-11-12 VITALS — BP 116/51 | HR 99 | Ht 67.0 in | Wt 127.7 lb

## 2017-11-12 DIAGNOSIS — Z87442 Personal history of urinary calculi: Secondary | ICD-10-CM

## 2017-11-12 DIAGNOSIS — Z09 Encounter for follow-up examination after completed treatment for conditions other than malignant neoplasm: Secondary | ICD-10-CM | POA: Diagnosis not present

## 2017-11-12 LAB — MICROSCOPIC EXAMINATION

## 2017-11-12 LAB — URINALYSIS, COMPLETE
BILIRUBIN UA: NEGATIVE
GLUCOSE, UA: NEGATIVE
KETONES UA: NEGATIVE
Nitrite, UA: NEGATIVE
PH UA: 5.5 (ref 5.0–7.5)
SPEC GRAV UA: 1.015 (ref 1.005–1.030)
UUROB: 0.2 mg/dL (ref 0.2–1.0)

## 2017-11-12 MED ORDER — LIDOCAINE HCL URETHRAL/MUCOSAL 2 % EX GEL
1.0000 "application " | Freq: Once | CUTANEOUS | Status: AC
  Administered 2017-11-12: 1 via URETHRAL

## 2017-11-12 MED ORDER — CIPROFLOXACIN HCL 500 MG PO TABS
500.0000 mg | ORAL_TABLET | Freq: Once | ORAL | Status: AC
Start: 2017-11-12 — End: 2017-11-12
  Administered 2017-11-12: 500 mg via ORAL

## 2017-11-12 NOTE — Progress Notes (Signed)
Indications: Patient is 42 y.o., female  presenting today for stent removal.  To my history physical this date for details.    Procedure:  Flexible Cystoscopy with stent removal ([id number])  Timeout was performed and the correct patient, procedure and participants were identified.    Description:  The patient was prepped and draped in the usual sterile fashion. Flexible cystosopy was performed.  The stent was visualized, grasped, and removed intact without difficulty. The patient tolerated the procedure well.  A single dose of oral antibiotics was given.  Complications:  None  Plan: She was instructed to call for development of flank pain post stent removal and will otherwise follow-up in 6 months with a KUB.

## 2017-11-12 NOTE — Progress Notes (Signed)
11/12/2017 10:15 AM   Joann Haley 11-Feb-1976 [id number]  Referring provider: No referring provider defined for this encounter.  Chief Complaint  Patient presents with  . Other    HPI: Joann Haley is a 42 year old female who presented to the ED on 11/01/2017 with left renal colic.  She has Crohn's disease but no previous history of stone disease.  A CT of the abdomen pelvis was remarkable for a 6 mm left proximal ureteral calculus.  Her pain was controlled with parenteral analgesics and she was discharged on tamsulosin, hydrocodone and Zofran.  She had no significant pain for 48 hours and traveled to [place] on a business trip however had recurrent renal colic while in [place].  She was admitted to [facility] and subsequently underwent left ureteroscopic stone removal.  Her stone was noted to be in the left distal ureter and was fragmented and a stent was placed.  She is having moderate stent symptoms.   PMH: Past Medical History:  Diagnosis Date  . Crohn's disease [facility])     Surgical History: Past Surgical History:  Procedure Laterality Date  . APPENDECTOMY    . BREAST LUMPECTOMY Right 1997    Home Medications:  Allergies as of 11/12/2017      Reactions   Cinnamon    Other reaction(s): SWELLING/EDEMA   Peppermint Oil    Other reaction(s): NAUSEA   Latex Rash, Itching   rash      Medication List        Accurate as of 11/12/17 10:15 AM. Always use your most recent med list.          b complex vitamins capsule Take by mouth.   B-12 PO Take by mouth.   ciprofloxacin 500 MG tablet Commonly known as:  CIPRO TK 1 T PO Q 12 H  FOR 3 DAYS   docusate sodium 100 MG capsule Commonly known as:  COLACE Take 1 tablet once or twice daily as needed for constipation while taking narcotic pain medicine   ferrous sulfate 325 (65 FE) MG EC tablet Take 325 mg by mouth.   folic acid 1 MG tablet Commonly known as:  FOLVITE Take 1 mg by mouth.     HYDROcodone-acetaminophen 5-325 MG tablet Commonly known as:  NORCO/VICODIN Take 1-2 tablets by mouth every 4 (four) hours as needed for moderate pain.   MAGNESIUM PO Take by mouth.   MULTI-VITAMINS Tabs Take by mouth.   ondansetron 4 MG disintegrating tablet Commonly known as:  ZOFRAN ODT Allow 1-2 tablets to dissolve in your mouth every 8 hours as needed for nausea/vomiting   oxyCODONE-acetaminophen 5-325 MG tablet Commonly known as:  PERCOCET/ROXICET Take 1 tablet by mouth every 4 (four) hours as needed. for pain   tamsulosin 0.4 MG Caps capsule Commonly known as:  FLOMAX Take 1 tablet by mouth daily until you pass the kidney stone or no longer have symptoms   UNABLE TO FIND Ancestral Grassfed Beef Organs Capsule 500 mg per capsule- at least 2 per day  Vital proteins Beef Liver Capsule 750 mg per capsule- at least 2 per day   Doctor's Best magnesium 100 mg - 2 tabs at night   UNABLE TO FIND Take by mouth.   VITAMIN K2-VITAMIN D3 PO Take by mouth.   XIFAXAN 550 MG Tabs tablet Generic drug:  rifaximin Xifaxan 550 mg tablet       Allergies:  Allergies  Allergen Reactions  . Cinnamon     Other reaction(s): SWELLING/EDEMA  .  Peppermint Oil     Other reaction(s): NAUSEA  . Latex Rash and Itching    rash    Family History: Family History  Problem Relation Age of Onset  . Diabetes Mother   . Multiple myeloma Father     Social History:  reports that she has never smoked. She has never used smokeless tobacco. She reports that she does not drink alcohol or use drugs.  ROS: UROLOGY Frequent Urination?: No Hard to postpone urination?: No Burning/pain with urination?: Yes Get up at night to urinate?: No Leakage of urine?: No Urine stream starts and stops?: No Trouble starting stream?: No Do you have to strain to urinate?: No Blood in urine?: No Urinary tract infection?: No Sexually transmitted disease?: No Injury to kidneys or bladder?: No Painful  intercourse?: No Weak stream?: No Currently pregnant?: No Vaginal bleeding?: No Last menstrual period?: 10/16/2017  Gastrointestinal Nausea?: No Vomiting?: No Indigestion/heartburn?: No Diarrhea?: Yes Constipation?: No  Constitutional Fever: No Night sweats?: No Weight loss?: No Fatigue?: Yes  Skin Skin rash/lesions?: No Itching?: No  Eyes Blurred vision?: Yes Double vision?: Yes  Ears/Nose/Throat Sore throat?: No Sinus problems?: Yes  Hematologic/Lymphatic Swollen glands?: No Easy bruising?: Yes  Cardiovascular Leg swelling?: No Chest pain?: No  Respiratory Cough?: No Shortness of breath?: No  Endocrine Excessive thirst?: Yes  Musculoskeletal Back pain?: Yes Joint pain?: Yes  Neurological Headaches?: Yes Dizziness?: Yes  Psychologic Depression?: No Anxiety?: No  Physical Exam: BP (!) 116/51 (BP Location: Right Arm, Patient Position: Sitting, Cuff Size: Small)   Pulse 99   Ht '5\' 7"'  (1.702 m)   Wt 127 lb 11.2 oz (57.9 kg)   LMP 10/17/2017 (Approximate)   SpO2 99%   BMI 20.00 kg/m   Constitutional:  Alert and oriented, No acute distress. HEENT: [place] AT, moist mucus membranes.  Trachea midline, no masses. Cardiovascular: No clubbing, cyanosis, or edema. Respiratory: Normal respiratory effort, no increased work of breathing. GI: Abdomen is soft, nontender, nondistended, no abdominal masses GU: No CVA tenderness Lymph: No cervical or inguinal lymphadenopathy. Skin: No rashes, bruises or suspicious lesions. Neurologic: Grossly intact, no focal deficits, moving all 4 extremities. Psychiatric: Normal mood and affect.   Pertinent Imaging: Images were personally reviewed  Results for orders placed during the hospital encounter of 11/01/17  CT Renal Stone Study   Narrative CLINICAL DATA:  Acute onset of left flank pain and nausea. Lower back pain. Personal history of Crohn's disease.  EXAM: CT ABDOMEN AND PELVIS WITHOUT  CONTRAST  TECHNIQUE: Multidetector CT imaging of the abdomen and pelvis was performed following the standard protocol without IV contrast.  COMPARISON:  CT of the abdomen and pelvis performed 10/08/2016  FINDINGS: Lower chest: The visualized lung bases are grossly clear. The visualized portions of the mediastinum are unremarkable.  Hepatobiliary: The liver is unremarkable in appearance. The gallbladder is unremarkable in appearance. The common bile duct remains normal in caliber.  Pancreas: The pancreas is within normal limits.  Spleen: The spleen is unremarkable in appearance.  Adrenals/Urinary Tract: The adrenal glands are unremarkable.  Mild left-sided hydronephrosis is noted, with an obstructing 6 x 5 mm stone noted proximally at the left ureteropelvic junction. The right kidney is unremarkable in appearance. No nonobstructing renal stones are identified. No significant perinephric stranding is seen.  Stomach/Bowel: There is diffuse wall thickening along the distal ileum, reflecting exacerbation of the patient's Crohn's disease. The patient is status post appendectomy. Mild fatty infiltration of the wall of the colon reflects sequelae of  chronic inflammation.  Vascular/Lymphatic: The abdominal aorta is unremarkable in appearance. The inferior vena cava is grossly unremarkable. No retroperitoneal lymphadenopathy is seen. No pelvic sidewall lymphadenopathy is identified.  Reproductive: The bladder is mildly distended and grossly unremarkable. The uterus is grossly unremarkable. No suspicious adnexal masses are seen.  Other: No additional soft tissue abnormalities are seen.  Musculoskeletal: No acute osseous abnormalities are identified. The visualized musculature is unremarkable in appearance.  IMPRESSION: 1. Mild left-sided hydronephrosis, with an obstructing 6 x 5 mm stone noted proximally at the left ureteropelvic junction. 2. Diffuse wall thickening along the  distal ileum, reflecting exacerbation of the patient's Crohn's disease. 3. Mild fatty infiltration of the wall of the colon reflects sequelae of chronic inflammation.   Electronically Signed   By: Garald Balding M.D.   On: 11/01/2017 05:22     Assessment & Plan:   42 year old female status post ureteroscopic removal of a left distal ureteral calculus on 11/04/2017.  She is having moderate stent symptoms and will go ahead and remove her stent today.  I discussed obtaining a metabolic evaluation as she is an increased risk for further stone formation related to her Crohn's disease.  She elected general stone prevention guidelines and we discussed increasing her water intake keep urine output greater than 2 L/day.  Dietary oxalate moderation and a low-sodium diet was also discussed.  She was provided literature on stone prevention.  Follow-up 6 months with a KUB.    Return in about 6 months (around 05/14/2018) for Recheck, KUB.  Abbie Sons, [place] [address] [place], [place] [address] [phone]

## 2017-11-12 NOTE — Addendum Note (Signed)
Addended by: Frankey ShownGLANTON, Berma Harts C on: 11/12/2017 11:37 AM   Modules accepted: Orders

## 2017-12-26 ENCOUNTER — Ambulatory Visit: Payer: Self-pay | Admitting: Urology

## 2018-05-17 ENCOUNTER — Other Ambulatory Visit: Payer: Self-pay | Admitting: Urology

## 2018-05-17 DIAGNOSIS — Z87442 Personal history of urinary calculi: Secondary | ICD-10-CM

## 2018-05-22 ENCOUNTER — Ambulatory Visit
Admission: RE | Admit: 2018-05-22 | Discharge: 2018-05-22 | Disposition: A | Discharge status: Home / Self Care | Payer: BLUE CROSS/BLUE SHIELD | Attending: Urology | Admitting: Urology

## 2018-05-22 ENCOUNTER — Ambulatory Visit
Admission: RE | Admit: 2018-05-22 | Discharge: 2018-05-22 | Disposition: A | Discharge status: Home / Self Care | Payer: BLUE CROSS/BLUE SHIELD | Source: Ambulatory Visit | Attending: Urology | Admitting: Urology

## 2018-05-22 DIAGNOSIS — Z87442 Personal history of urinary calculi: Secondary | ICD-10-CM | POA: Diagnosis not present

## 2018-05-22 DIAGNOSIS — N200 Calculus of kidney: Secondary | ICD-10-CM | POA: Diagnosis present

## 2018-05-25 ENCOUNTER — Ambulatory Visit: Payer: BLUE CROSS/BLUE SHIELD | Admitting: Urology

## 2018-05-26 ENCOUNTER — Telehealth: Payer: Self-pay

## 2018-05-26 NOTE — Telephone Encounter (Signed)
-----   Message from Riki AltesScott C Stoioff, MD sent at 05/25/2018  7:59 PM EST ----- KUB showed no stones.  Recommend one-year follow-up/KUB

## 2018-05-26 NOTE — Telephone Encounter (Signed)
Called pt, no answer. Left detailed message for pt informing her of the information below. Advised pt to call back for questions or concerns.  

## 2018-10-05 ENCOUNTER — Other Ambulatory Visit: Payer: Self-pay | Admitting: Internal Medicine

## 2018-10-05 DIAGNOSIS — M5432 Sciatica, left side: Secondary | ICD-10-CM

## 2018-10-12 ENCOUNTER — Ambulatory Visit
Admission: RE | Admit: 2018-10-12 | Discharge: 2018-10-12 | Disposition: A | Discharge status: Home / Self Care | Payer: BLUE CROSS/BLUE SHIELD | Source: Ambulatory Visit | Attending: Internal Medicine | Admitting: Internal Medicine

## 2018-10-12 ENCOUNTER — Other Ambulatory Visit: Payer: Self-pay

## 2018-10-12 DIAGNOSIS — M5432 Sciatica, left side: Secondary | ICD-10-CM | POA: Diagnosis not present

## 2020-10-18 IMAGING — CR DG ABDOMEN 1V
1 series · 2 of 2 positions shown · non-contrast
Comparison: CT, 11/01/2017

CLINICAL DATA: Hx of kidney stones. Patient states that she had a
kidney stone in left kidney that had to be removed and had a stent
placed in November 2017. Patient states no current symptoms. Hx of
crohn's disease.

EXAM:
ABDOMEN - 1 VIEW

[Series 1: dg abd 1 view · 0.14mm/px · 2 of 2 slices shown]
[im 1/2]
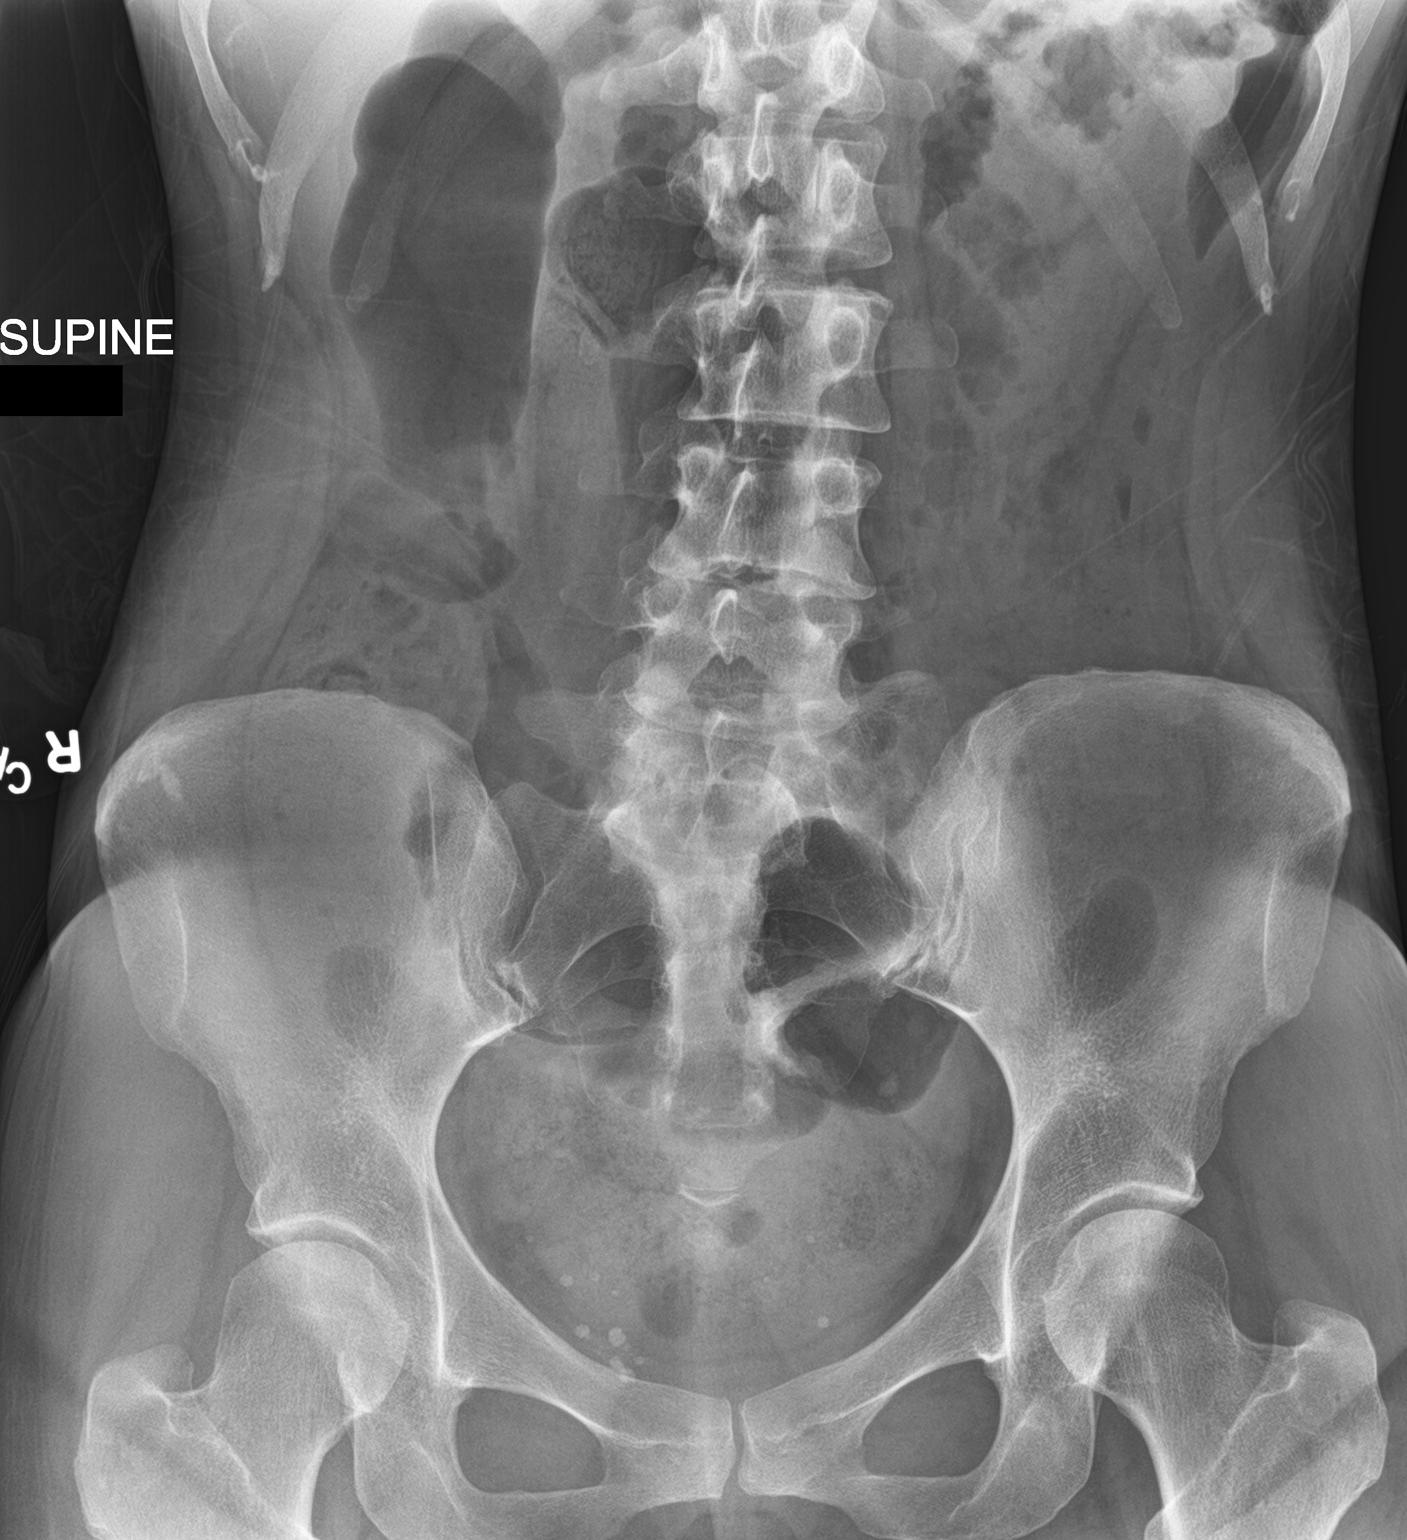
[im 2/2]
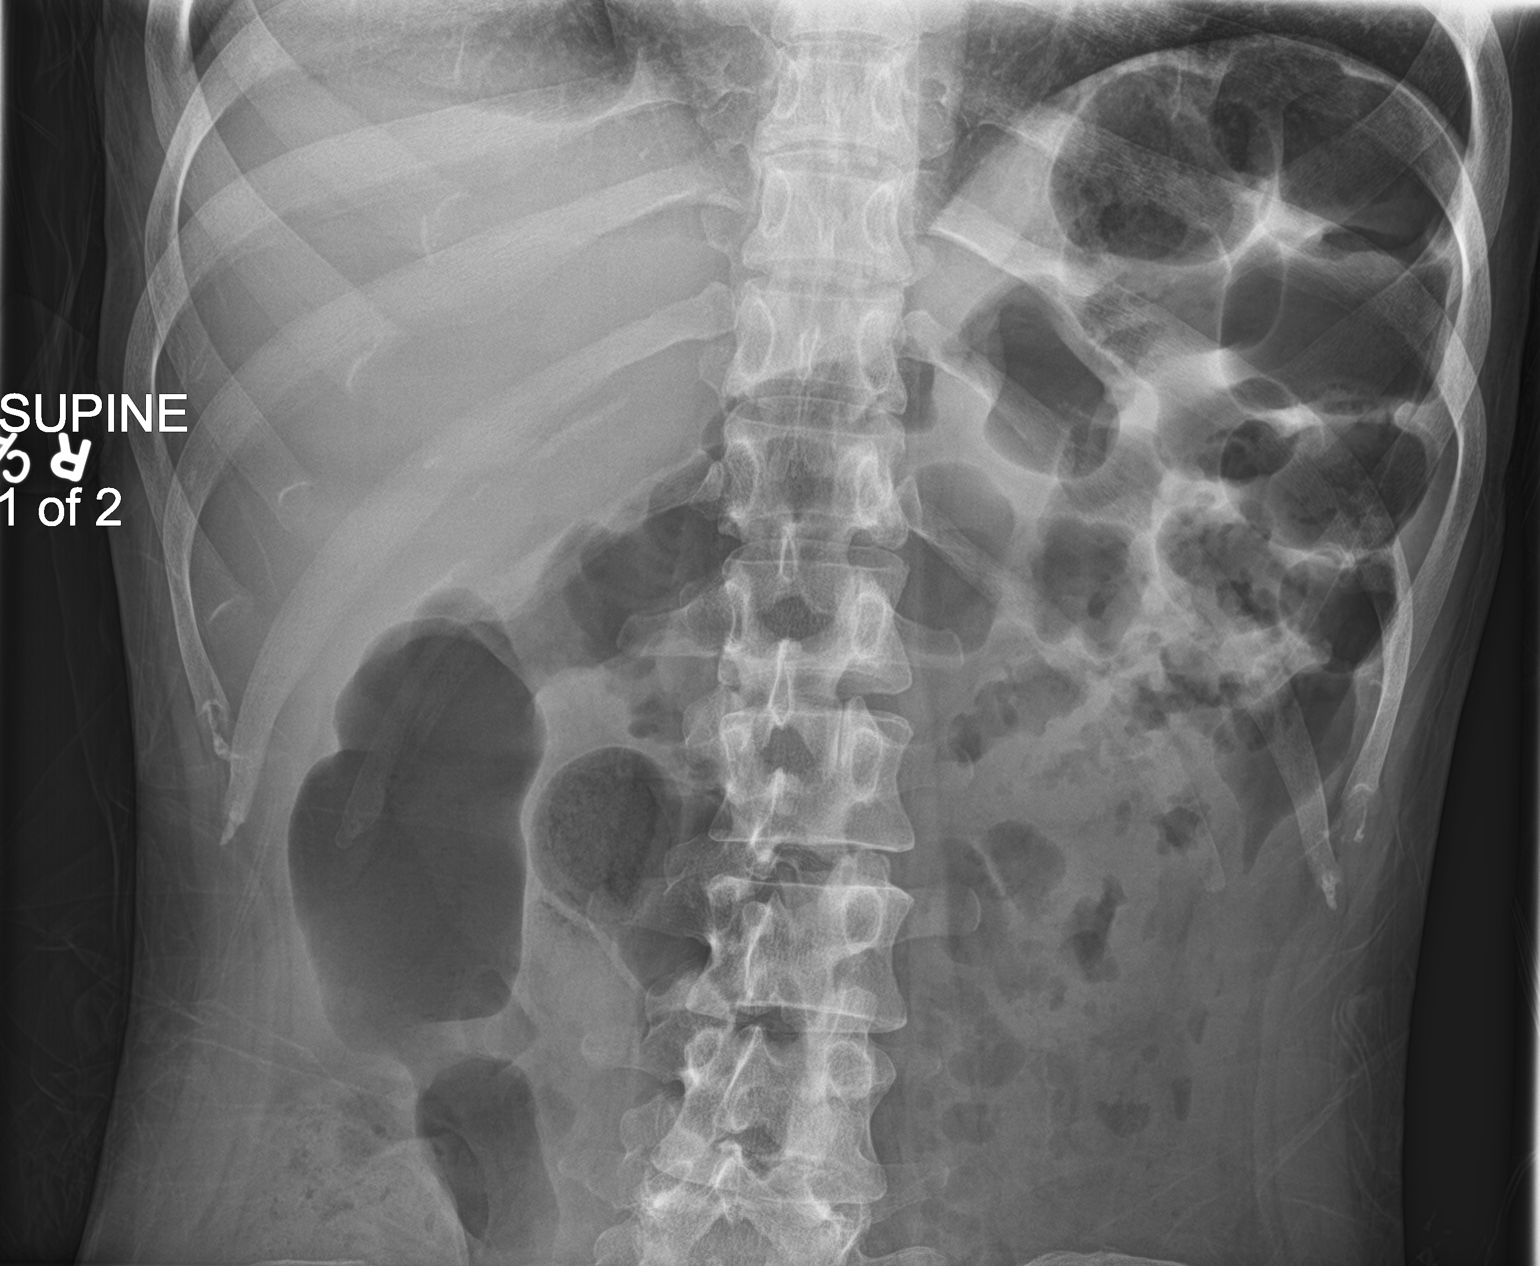

[2 of 2 positions shown; findings below may reference images not displayed]

FINDINGS: Normal bowel gas pattern.

No visualized renal stone. No evidence of a ureteral stone. There
are multiple round phleboliths in the pelvis, present on the prior
CT.

Mild curvature of the lumbar spine, convex the left. Skeletal
structures otherwise unremarkable.
IMPRESSION: 1. No acute findings. No radiographic evidence of a renal or
ureteral stone.

## 2021-03-10 IMAGING — MR MRI LUMBAR SPINE WITHOUT CONTRAST
5 series · 31 of 48 positions shown · non-contrast
Comparison: CT abdomen pelvis 11/01/2017

CLINICAL DATA: Left-sided sciatica

EXAM:
MRI LUMBAR SPINE WITHOUT CONTRAST
TECHNIQUE: Multiplanar, multisequence MR imaging of the lumbar spine was
performed. No intravenous contrast was administered.

[Series 5: T2 · sagittal · 4.0mm · 0.81mm/px · 6 of 17 slices shown (1 of 2)]
[im 1/17]
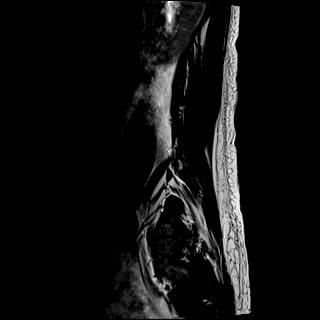
[im 4/17]
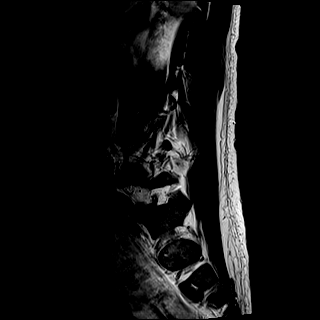
[im 7/17]
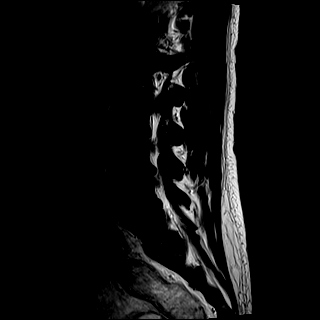
[im 10/17]
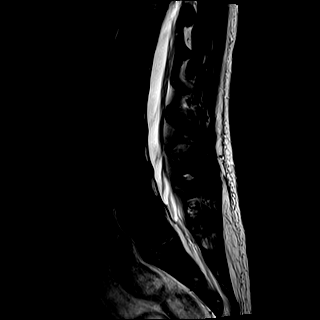
[im 13/17]
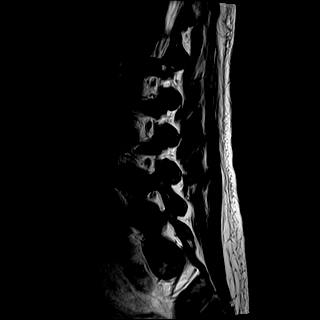
[im 17/17]
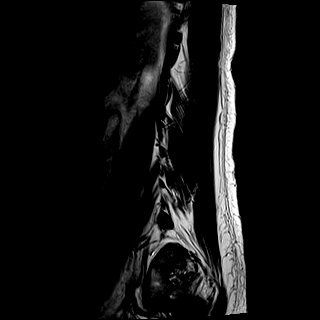

[Series 6: T1 · sagittal · 4.0mm · 0.81mm/px · 6 of 17 slices shown (1 of 2)]
[im 1/17]
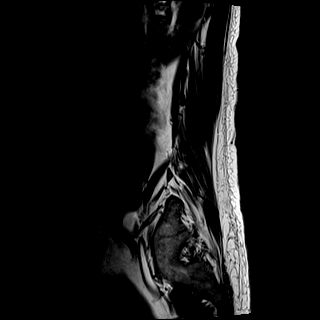
[im 4/17]
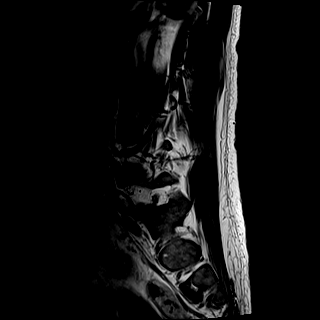
[im 7/17]
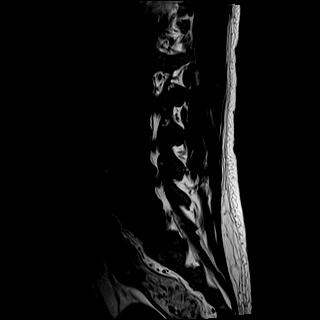
[im 10/17]
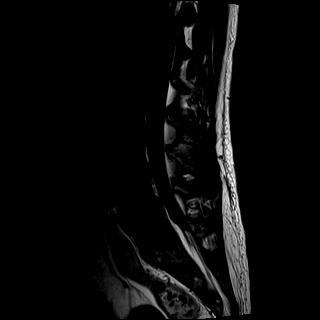
[im 13/17]
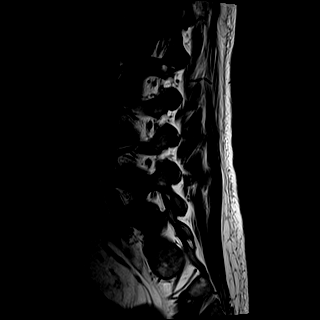
[im 17/17]
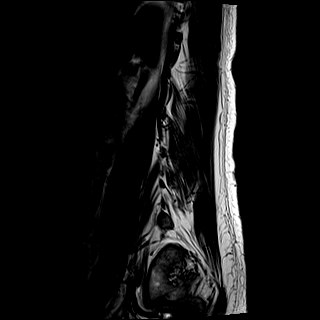

[Series 7: STIR · sagittal · 4.0mm · 0.41mm/px · 1 of 17 slices shown]
[im 1/17]
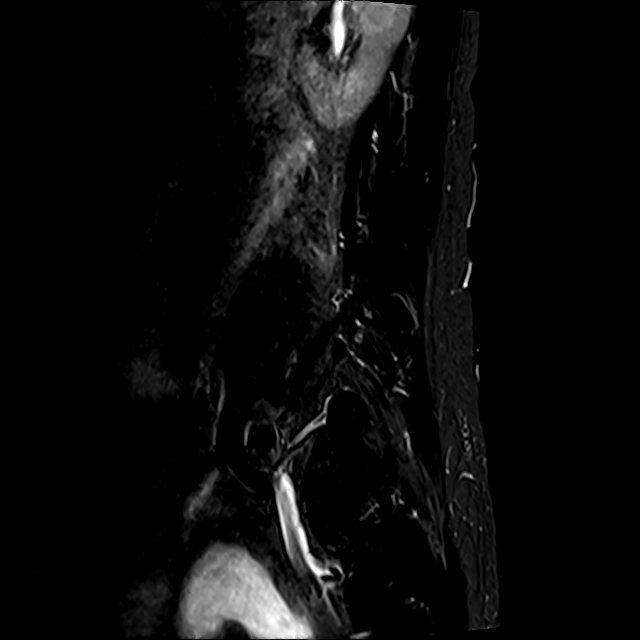

[Series 8: T2 · axial · 4.0mm · 0.78mm/px · z∈[-80,+139]mm · 9 of 39 slices shown (2 of 2)]
[im 1/39]
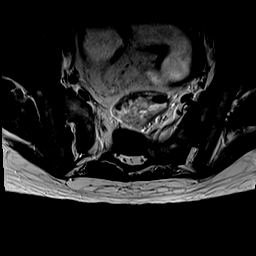
[im 6/39]
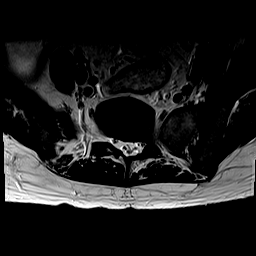
[im 11/39]
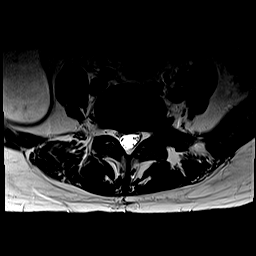
[im 17/39]
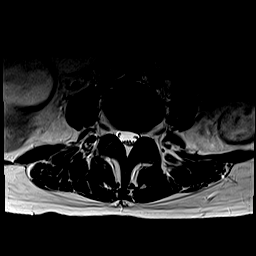
[im 20/39]
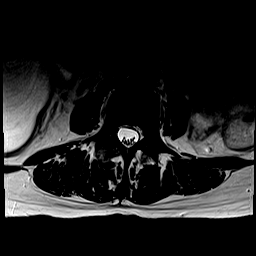
[im 22/39]
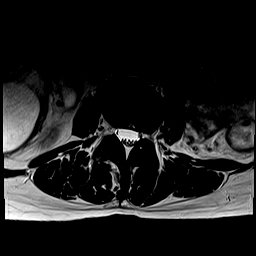
[im 28/39]
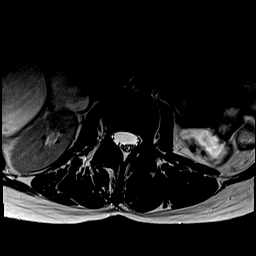
[im 33/39]
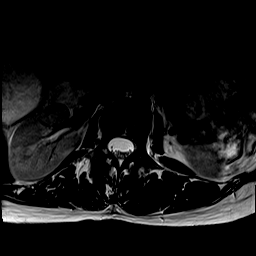
[im 39/39]
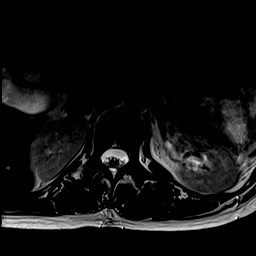

[Series 9: T1 · axial · 4.0mm · 0.39mm/px · z∈[-80,+139]mm · 9 of 39 slices shown (2 of 2)]
[im 1/39]
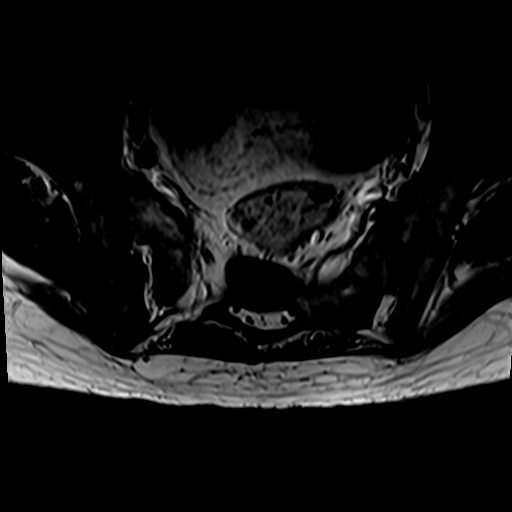
[im 6/39]
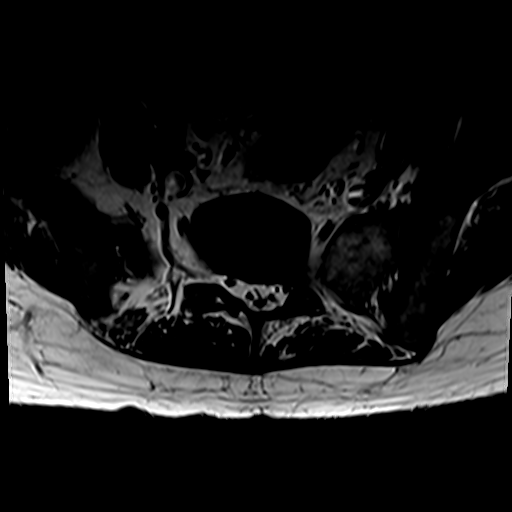
[im 11/39]
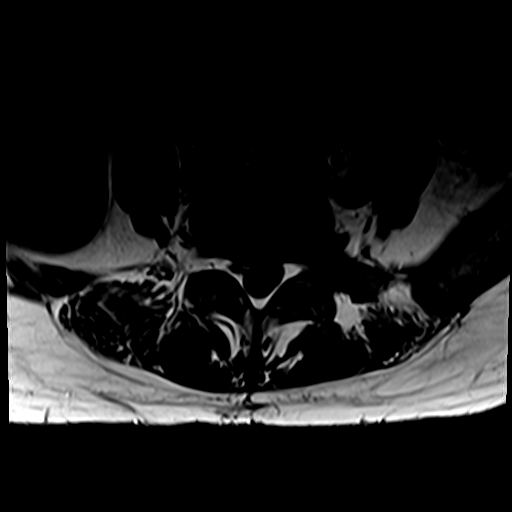
[im 17/39]
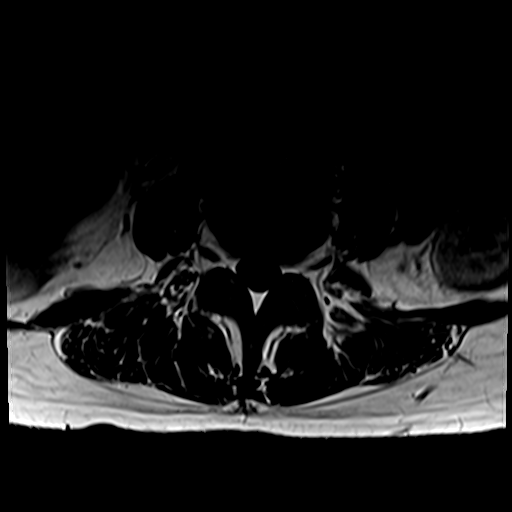
[im 20/39]
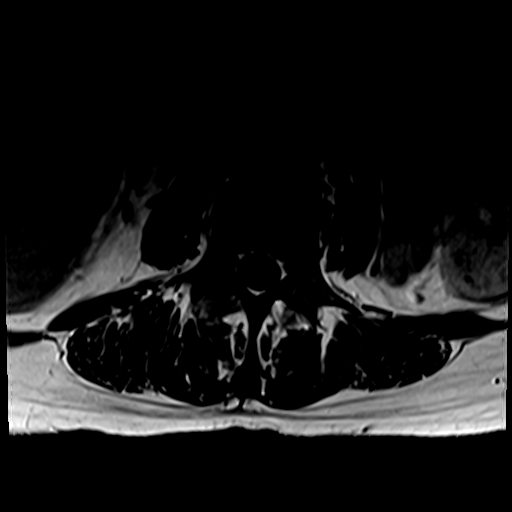
[im 22/39]
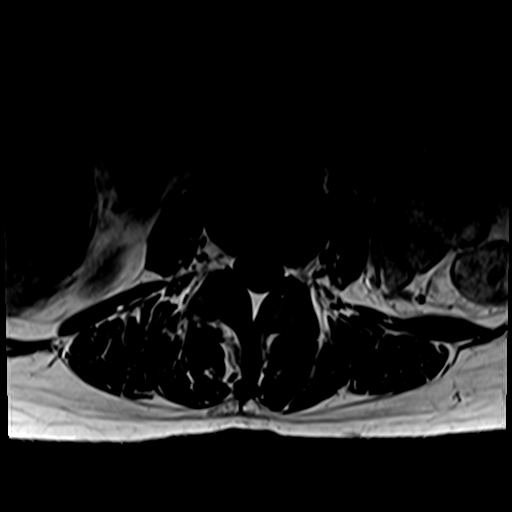
[im 28/39]
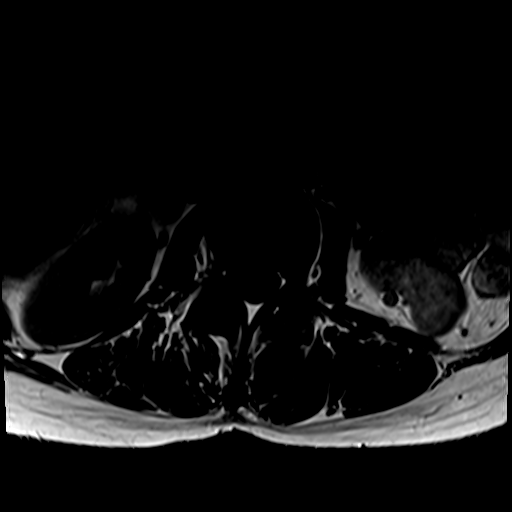
[im 33/39]
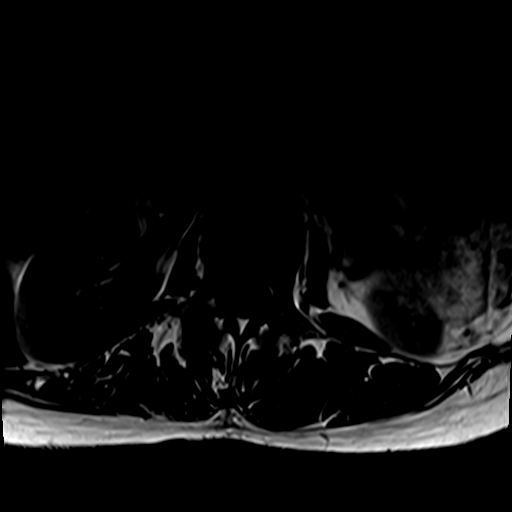
[im 39/39]
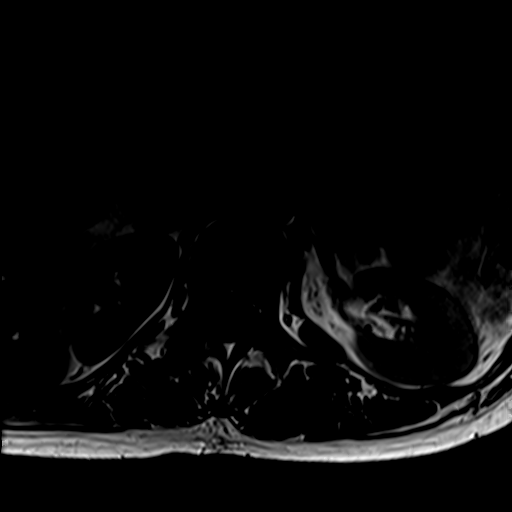

[31 of 48 positions shown; findings below may reference images not displayed]

FINDINGS: Segmentation: Normal. S1 is partially lumbarized based on prior CT
with 6 non-rib-bearing lumbar segments. Partially developed disc
space at S1-2

Alignment:  Normal

Vertebrae: Negative for fracture or mass. Diffuse low signal in the
bone marrow on T1 and T[DATE] be due to anemia. No focal bone marrow
lesion.

Conus medullaris and cauda equina: Conus extends to the L1-2 level.
Conus and cauda equina appear normal.

Paraspinal and other soft tissues: No paraspinous mass or
adenopathy. Dilated fluid-filled colon compatible with known Crohn's
disease.

Disc levels:

L1-2: Negative

L2-3: Negative

L3-4: Mild disc degeneration. Negative for disc protrusion or
stenosis

L4-5: Moderate disc degeneration with disc space narrowing and
diffuse endplate spurring left greater than right. Mild subarticular
stenosis on the left and mild spinal stenosis

L5-S1: Moderate disc degeneration with disc space narrowing. Small
central disc protrusion without stenosis or neural impingement.
IMPRESSION: S1 is partially lumbarized.

Disc degeneration and spurring at L4-5 left greater than right
causing mild subarticular stenosis on the left

Small central disc protrusion and spurring L5-S1 without neural
impingement.

Bone marrow is diffusely low signal suggesting cellular marrow.
Question anemia.
